# Patient Record
Sex: Male | Born: 1960 | Race: White | Hispanic: No | Marital: Married | Smoking: Former smoker
Health system: Southern US, Community
[De-identification: ages and names within clinical notes are randomized; demographics above are authoritative.]

## PROBLEM LIST (undated history)

## (undated) DIAGNOSIS — J449 Chronic obstructive pulmonary disease, unspecified: Secondary | ICD-10-CM

## (undated) DIAGNOSIS — R911 Solitary pulmonary nodule: Secondary | ICD-10-CM

## (undated) DIAGNOSIS — F419 Anxiety disorder, unspecified: Secondary | ICD-10-CM

## (undated) DIAGNOSIS — N200 Calculus of kidney: Secondary | ICD-10-CM

## (undated) DIAGNOSIS — I10 Essential (primary) hypertension: Secondary | ICD-10-CM

## (undated) DIAGNOSIS — D126 Benign neoplasm of colon, unspecified: Secondary | ICD-10-CM

## (undated) DIAGNOSIS — E119 Type 2 diabetes mellitus without complications: Secondary | ICD-10-CM

## (undated) DIAGNOSIS — I7789 Other specified disorders of arteries and arterioles: Secondary | ICD-10-CM

## (undated) DIAGNOSIS — G4730 Sleep apnea, unspecified: Secondary | ICD-10-CM

## (undated) DIAGNOSIS — D649 Anemia, unspecified: Secondary | ICD-10-CM

## (undated) DIAGNOSIS — M545 Low back pain, unspecified: Secondary | ICD-10-CM

## (undated) DIAGNOSIS — R609 Edema, unspecified: Secondary | ICD-10-CM

## (undated) DIAGNOSIS — E1149 Type 2 diabetes mellitus with other diabetic neurological complication: Secondary | ICD-10-CM

## (undated) HISTORY — DX: Chronic obstructive pulmonary disease, unspecified: J44.9

## (undated) HISTORY — DX: Type 2 diabetes mellitus without complications: E11.9

## (undated) HISTORY — DX: Essential (primary) hypertension: I10

## (undated) HISTORY — DX: Sleep apnea, unspecified: G47.30

## (undated) HISTORY — DX: Benign neoplasm of colon, unspecified: D12.6

## (undated) HISTORY — PX: OTHER SURGICAL HISTORY: SHX169

## (undated) HISTORY — DX: Calculus of kidney: N20.0

## (undated) HISTORY — DX: Type 2 diabetes mellitus with other diabetic neurological complication: E11.49

## (undated) HISTORY — PX: APPENDECTOMY: SHX54

## (undated) HISTORY — DX: Edema, unspecified: R60.9

## (undated) HISTORY — DX: Anxiety disorder, unspecified: F41.9

## (undated) HISTORY — DX: Low back pain, unspecified: M54.50

## (undated) HISTORY — DX: Other specified disorders of arteries and arterioles: I77.89

## (undated) HISTORY — DX: Anemia, unspecified: D64.9

## (undated) HISTORY — DX: Solitary pulmonary nodule: R91.1

---

## 2000-06-19 ENCOUNTER — Encounter: Payer: Self-pay | Admitting: Family Medicine

## 2000-06-19 ENCOUNTER — Encounter: Admission: RE | Admit: 2000-06-19 | Discharge: 2000-06-19 | Payer: Self-pay | Admitting: Family Medicine

## 2007-01-20 ENCOUNTER — Observation Stay (HOSPITAL_COMMUNITY): Admission: RE | Admit: 2007-01-20 | Discharge: 2007-01-21 | Payer: Self-pay | Admitting: Orthopaedic Surgery

## 2009-03-22 IMAGING — RF DG LUMBAR SPINE 1V
1 series · 1 of 1 positions shown · non-contrast
Comparison: none

CLINICAL DATA: Disc herniation. 
 LATERAL LUMBAR SPINE ? 1 VIEW:

[Series 1: run · 1 of 1 slices shown]
[im 1/1]
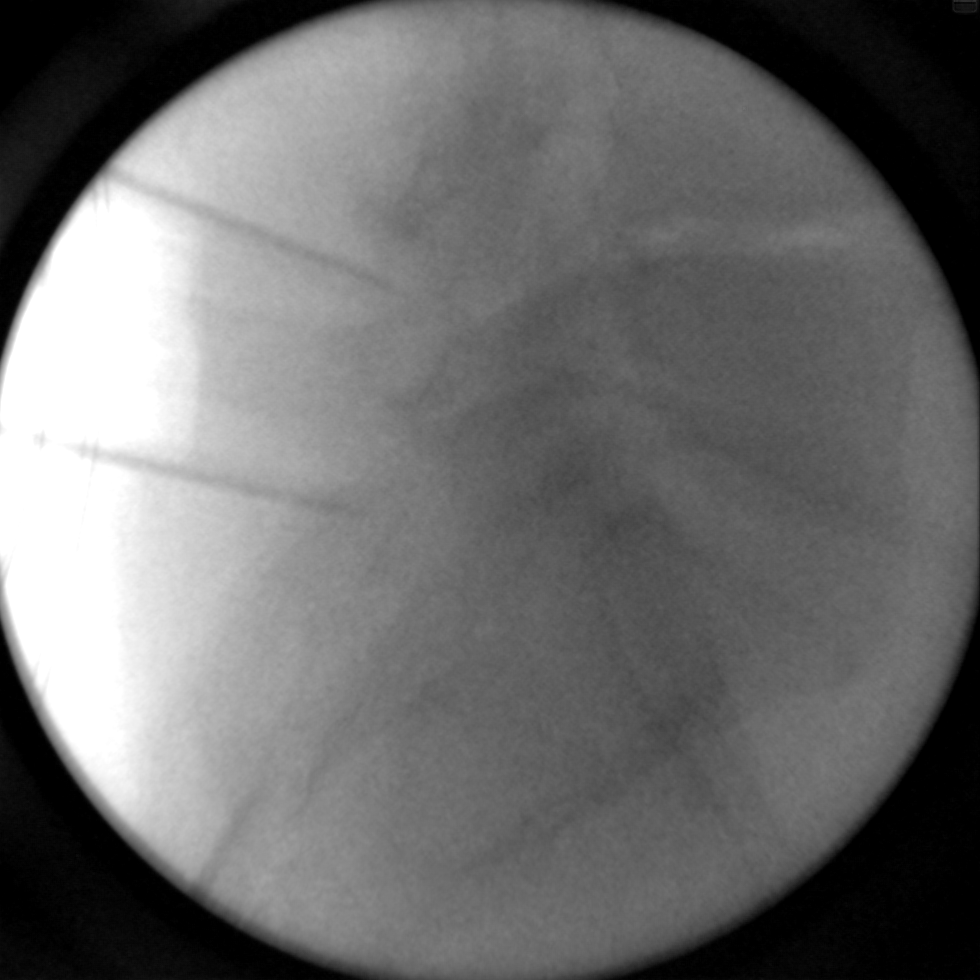

[1 of 1 positions shown; findings below may reference images not displayed]

FINDINGS: A single fluoroscopic intraoperative lateral view of the lumbar spine is provided.  A metallic probe is directed toward approximately the L-5 lamina with a second more inferior probe directed toward the S-1 level.
IMPRESSION: As discussed above.

## 2010-10-01 NOTE — Op Note (Signed)
Danny Ramsey, Danny Ramsey                 ACCOUNT NO.:  [id number]   MEDICAL RECORD NO.:  [id number]          PATIENT TYPE:  INP   LOCATION:  2550                         FACILITY:  [facility]   PHYSICIAN:  Sharolyn Douglas, M.D.        DATE OF BIRTH:  1960/05/28   DATE OF PROCEDURE:  01/20/2007  DATE OF DISCHARGE:                               OPERATIVE REPORT   DIAGNOSES:  1. 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  2. Lumbar degenerative disk disease, status post 2 previous      laminotomies.   PROCEDURE:  Revision right L5-S1 minimally invasive hemilaminotomy and  diskectomy.   SURGEON:  Sharolyn Douglas, M.D.   ASSISTANT:  Aura Fey. Bobbe Medico.   ANESTHESIA:  General endotracheal.   ESTIMATED BLOOD LOSS:  Minimal.   COMPLICATIONS:  None.   COUNTS:  Final needle and sponge count correct.   INDICATIONS:  The patient is a pleasant, 50 year old male with joint  back problems, history of 2 previous lumbar laminotomies for a ruptured  disk.  He has developed acute-on-chronic pain in his back and right leg  consistent with an S1 radiculopathy.  His imaging studies show an  extruded disk herniation at L5-S1 on the right.  He failed other  conservative treatment modalities and now elects to undergo revision  lumbar decompression at L5-S1 on the right side to address his S1  radiculopathy.  The risks, benefits and alternatives were reviewed and  the patient elected to proceed.   DESCRIPTION OF PROCEDURE:  After informed consent, he was taken to the  operating room.  He underwent general endotracheal anesthesia without  difficulty and was given prophylactic IV antibiotics.  Carefully turned  prone onto the Wilson frame and all bony prominences padded.  Face and  eyes protected at all times.  The back was prepped and draped in the  usual sterile fashion.  The inferior portion of his previous incision  was utilized.  The incision measured 1 1/2 inches.  Dissection was  carried through the scar and deep adipose layer.  The patient is a very  large man and the depth of the incision exceeded 8 cm.  The deep fascia  was incised and dilators from the minimally invasive Abbott spine  retractor were used to dock onto the L5-S1 interspace on the right side.  Fluoroscopy was used to place the retractor over the interspace.  The 80-  mm retractor blades were placed onto the retractor and over the final  dilator.  The retractor was attached to the operating room table and  then gently spread to expose the L5-S1 interspace.  Final fluoroscopic  image was taken, confirming correct positioning.  The microscope was  draped and brought into the field.  Using a high-speed bur, the inferior  1/3 of the L5 lamina was removed, along with the medial 1/3 of the facet  joint complex.  Ligamentum flavum was removed piecemeal.  The lateral  border of the thecal sac and S1 nerve root were identified and gently  mobilized medially.  We found a bulging disk immediately, and this was  entered using a 15 blade.  This was decompressed back into the  interspace using Epstein curets and then removed with a pituitary  rongeur.  We then proceeded to remove multiple degenerative fragments of  disk, which were underneath the ligament.  We explored the spinal canal  distally medial to the S1 pedicle and removed additional fragments.  In  the end, we were satisfied with the decompression.  We had removed all  loose material from the interspace, and also were satisfied that the S1  nerve root was decompressed in the lateral recess.  Hemostasis was  achieved using bipolar electrocautery and Gelfoam.  A small amount of  Gelfoam was left over the exposed epidural space.  The deep fascia was  closed with #1 Vicryl, the subcutaneous layer closed with 0 Vicryl and 2-  0 Vicryl, followed by a running 3-0 subcuticular Vicryl suture on the  skin edges.  Marcaine 0.5% 10 mL with epinephrine injected  for  postoperative analgesia.  Dermabond was applied to the skin edges.  The  patient was turned supine, extubated without difficulty and transferred  to recovery in stable condition.   It should be noted that my assistant, [name], P.A., was present  throughout the procedure.  She assisted me with the positioning.  She  assisted with exposure, using suction.  She then worked with me under  the microscope during the decompression, retracting the neural elements  and providing suction.  Finally, she assisted with wound closure.      Sharolyn Douglas, M.D.  Electronically Signed     MC/MEDQ  D:  01/20/2007  T:  01/21/2007  Job:  [id number]

## 2011-02-28 LAB — CBC
HCT: 38.3 — ABNORMAL LOW
Hemoglobin: 12.9 — ABNORMAL LOW
MCHC: 33.6
MCV: 86.6
Platelets: 285
RBC: 4.42
RDW: 13.9
WBC: 15.6 — ABNORMAL HIGH

## 2011-02-28 LAB — URINE CULTURE: Colony Count: NO GROWTH

## 2011-02-28 LAB — COMPREHENSIVE METABOLIC PANEL
AST: 25
Albumin: 3.8
Calcium: 9.3
Creatinine, Ser: 1.01
GFR calc Af Amer: >60
GFR calc non Af Amer: >60
Sodium: 139
Total Protein: 6.5

## 2011-02-28 LAB — DIFFERENTIAL
Basophils Relative: 0
Eosinophils Relative: 1
Lymphs Abs: 4.2 — ABNORMAL HIGH
Monocytes Relative: 6
Neutro Abs: 10.3 — ABNORMAL HIGH

## 2011-02-28 LAB — TYPE AND SCREEN: Antibody Screen: NEGATIVE

## 2011-02-28 LAB — URINALYSIS, ROUTINE W REFLEX MICROSCOPIC
Ketones, ur: NEGATIVE
Nitrite: NEGATIVE
pH: 5.5

## 2011-02-28 LAB — APTT: aPTT: 30

## 2011-06-30 ENCOUNTER — Other Ambulatory Visit: Payer: Self-pay | Admitting: Oncology

## 2011-06-30 ENCOUNTER — Other Ambulatory Visit (HOSPITAL_COMMUNITY)
Admission: RE | Admit: 2011-06-30 | Discharge: 2011-06-30 | Disposition: A | Discharge status: Home / Self Care | Payer: Commercial Managed Care - PPO | Source: Ambulatory Visit | Attending: Oncology | Admitting: Oncology

## 2011-06-30 DIAGNOSIS — D72829 Elevated white blood cell count, unspecified: Secondary | ICD-10-CM | POA: Insufficient documentation

## 2013-10-05 HISTORY — PX: COLONOSCOPY: SHX174

## 2015-06-05 DIAGNOSIS — E119 Type 2 diabetes mellitus without complications: Secondary | ICD-10-CM | POA: Diagnosis not present

## 2015-06-05 DIAGNOSIS — C9110 Chronic lymphocytic leukemia of B-cell type not having achieved remission: Secondary | ICD-10-CM | POA: Diagnosis not present

## 2015-06-05 DIAGNOSIS — Z720 Tobacco use: Secondary | ICD-10-CM | POA: Diagnosis not present

## 2016-09-10 DIAGNOSIS — I10 Essential (primary) hypertension: Secondary | ICD-10-CM | POA: Diagnosis not present

## 2016-09-10 DIAGNOSIS — G4730 Sleep apnea, unspecified: Secondary | ICD-10-CM | POA: Diagnosis not present

## 2016-09-10 DIAGNOSIS — Z125 Encounter for screening for malignant neoplasm of prostate: Secondary | ICD-10-CM | POA: Diagnosis not present

## 2016-09-10 DIAGNOSIS — E1149 Type 2 diabetes mellitus with other diabetic neurological complication: Secondary | ICD-10-CM | POA: Diagnosis not present

## 2017-03-20 DIAGNOSIS — C9190 Lymphoid leukemia, unspecified not having achieved remission: Secondary | ICD-10-CM | POA: Diagnosis not present

## 2017-03-20 DIAGNOSIS — E1149 Type 2 diabetes mellitus with other diabetic neurological complication: Secondary | ICD-10-CM | POA: Diagnosis not present

## 2017-03-20 DIAGNOSIS — I10 Essential (primary) hypertension: Secondary | ICD-10-CM | POA: Diagnosis not present

## 2017-03-20 DIAGNOSIS — D126 Benign neoplasm of colon, unspecified: Secondary | ICD-10-CM | POA: Diagnosis not present

## 2017-09-25 DIAGNOSIS — C9190 Lymphoid leukemia, unspecified not having achieved remission: Secondary | ICD-10-CM | POA: Diagnosis not present

## 2017-09-25 DIAGNOSIS — I10 Essential (primary) hypertension: Secondary | ICD-10-CM | POA: Diagnosis not present

## 2017-09-25 DIAGNOSIS — J449 Chronic obstructive pulmonary disease, unspecified: Secondary | ICD-10-CM | POA: Diagnosis not present

## 2017-09-25 DIAGNOSIS — E1149 Type 2 diabetes mellitus with other diabetic neurological complication: Secondary | ICD-10-CM | POA: Diagnosis not present

## 2018-03-31 DIAGNOSIS — E1149 Type 2 diabetes mellitus with other diabetic neurological complication: Secondary | ICD-10-CM | POA: Diagnosis not present

## 2018-03-31 DIAGNOSIS — Z23 Encounter for immunization: Secondary | ICD-10-CM | POA: Diagnosis not present

## 2018-03-31 DIAGNOSIS — C9110 Chronic lymphocytic leukemia of B-cell type not having achieved remission: Secondary | ICD-10-CM | POA: Diagnosis not present

## 2018-03-31 DIAGNOSIS — I10 Essential (primary) hypertension: Secondary | ICD-10-CM | POA: Diagnosis not present

## 2018-03-31 DIAGNOSIS — J449 Chronic obstructive pulmonary disease, unspecified: Secondary | ICD-10-CM | POA: Diagnosis not present

## 2018-10-05 DIAGNOSIS — I10 Essential (primary) hypertension: Secondary | ICD-10-CM | POA: Diagnosis not present

## 2018-10-05 DIAGNOSIS — J449 Chronic obstructive pulmonary disease, unspecified: Secondary | ICD-10-CM | POA: Diagnosis not present

## 2018-10-05 DIAGNOSIS — E1149 Type 2 diabetes mellitus with other diabetic neurological complication: Secondary | ICD-10-CM | POA: Diagnosis not present

## 2019-07-12 ENCOUNTER — Encounter: Payer: Self-pay | Admitting: Gastroenterology

## 2019-08-05 ENCOUNTER — Encounter: Payer: Commercial Managed Care - PPO | Admitting: Gastroenterology

## 2020-06-02 DIAGNOSIS — I517 Cardiomegaly: Secondary | ICD-10-CM

## 2020-11-13 ENCOUNTER — Other Ambulatory Visit: Payer: Self-pay

## 2020-11-13 DIAGNOSIS — M545 Low back pain, unspecified: Secondary | ICD-10-CM | POA: Insufficient documentation

## 2020-11-13 DIAGNOSIS — D649 Anemia, unspecified: Secondary | ICD-10-CM | POA: Insufficient documentation

## 2020-11-13 DIAGNOSIS — R609 Edema, unspecified: Secondary | ICD-10-CM | POA: Insufficient documentation

## 2020-11-13 DIAGNOSIS — D126 Benign neoplasm of colon, unspecified: Secondary | ICD-10-CM | POA: Insufficient documentation

## 2020-11-13 DIAGNOSIS — E1149 Type 2 diabetes mellitus with other diabetic neurological complication: Secondary | ICD-10-CM | POA: Insufficient documentation

## 2020-11-13 DIAGNOSIS — F419 Anxiety disorder, unspecified: Secondary | ICD-10-CM | POA: Insufficient documentation

## 2020-11-13 DIAGNOSIS — G4730 Sleep apnea, unspecified: Secondary | ICD-10-CM | POA: Insufficient documentation

## 2020-11-13 DIAGNOSIS — I7789 Other specified disorders of arteries and arterioles: Secondary | ICD-10-CM | POA: Insufficient documentation

## 2020-11-13 DIAGNOSIS — N200 Calculus of kidney: Secondary | ICD-10-CM | POA: Insufficient documentation

## 2020-11-13 DIAGNOSIS — I10 Essential (primary) hypertension: Secondary | ICD-10-CM | POA: Insufficient documentation

## 2020-11-13 DIAGNOSIS — J449 Chronic obstructive pulmonary disease, unspecified: Secondary | ICD-10-CM | POA: Insufficient documentation

## 2020-11-13 DIAGNOSIS — R911 Solitary pulmonary nodule: Secondary | ICD-10-CM | POA: Insufficient documentation

## 2020-11-15 ENCOUNTER — Ambulatory Visit: Payer: Self-pay | Admitting: Cardiology

## 2020-12-12 ENCOUNTER — Other Ambulatory Visit: Payer: Self-pay

## 2020-12-12 DIAGNOSIS — N200 Calculus of kidney: Secondary | ICD-10-CM | POA: Insufficient documentation

## 2020-12-12 DIAGNOSIS — E119 Type 2 diabetes mellitus without complications: Secondary | ICD-10-CM | POA: Insufficient documentation

## 2020-12-14 ENCOUNTER — Ambulatory Visit: Payer: Managed Care, Other (non HMO) | Admitting: Cardiology

## 2020-12-14 ENCOUNTER — Other Ambulatory Visit: Payer: Self-pay

## 2020-12-14 ENCOUNTER — Encounter: Payer: Self-pay | Admitting: Cardiology

## 2020-12-14 VITALS — BP 186/84 | HR 65 | Ht 73.0 in | Wt 301.0 lb

## 2020-12-14 DIAGNOSIS — I10 Essential (primary) hypertension: Secondary | ICD-10-CM | POA: Diagnosis not present

## 2020-12-14 DIAGNOSIS — G4730 Sleep apnea, unspecified: Secondary | ICD-10-CM

## 2020-12-14 DIAGNOSIS — I700 Atherosclerosis of aorta: Secondary | ICD-10-CM

## 2020-12-14 DIAGNOSIS — I7789 Other specified disorders of arteries and arterioles: Secondary | ICD-10-CM | POA: Diagnosis not present

## 2020-12-14 DIAGNOSIS — E1149 Type 2 diabetes mellitus with other diabetic neurological complication: Secondary | ICD-10-CM

## 2020-12-14 DIAGNOSIS — J431 Panlobular emphysema: Secondary | ICD-10-CM

## 2020-12-14 DIAGNOSIS — F17210 Nicotine dependence, cigarettes, uncomplicated: Secondary | ICD-10-CM

## 2020-12-14 HISTORY — DX: Atherosclerosis of aorta: I70.0

## 2020-12-14 HISTORY — DX: Nicotine dependence, cigarettes, uncomplicated: F17.210

## 2020-12-14 MED ORDER — ATORVASTATIN CALCIUM 10 MG PO TABS: 10.0000 mg | ORAL_TABLET | Freq: Every day | ORAL | 3 refills | Status: DC

## 2020-12-14 NOTE — Patient Instructions (Signed)
Medication Instructions:  Your physician has recommended you make the following change in your medication:   Start Atorvastatin 10 mg daily.  *If you need a refill on your cardiac medications before your next appointment, please call your pharmacy*   Lab Work: Your physician recommends that you return for lab work in: 6 weeks You need to have labs done when you are fasting.  You can come Monday through Friday 8:30 am to 12:00 pm and 1:15 to 4:30. You do not need to make an appointment as the order has already been placed. The labs you are going to have done are BMET, LFT and Lipids.  If you have labs (blood work) drawn today and your tests are completely normal, you will receive your results only by: [place] (if you have MyChart) OR A paper copy in the mail If you have any lab test that is abnormal or we need to change your treatment, we will call you to review the results.   Testing/Procedures: None ordered   Follow-Up: At [facility], you and your health needs are our priority.  As part of our continuing mission to provide you with exceptional heart care, we have created designated Provider Care Teams.  These Care Teams include your primary Cardiologist (physician) and Advanced Practice Providers (APPs -  Physician Assistants and Nurse Practitioners) who all work together to provide you with the care you need, when you need it.  We recommend signing up for the patient portal called "MyChart".  Sign up information is provided on this After Visit Summary.  MyChart is used to connect with patients for Virtual Visits (Telemedicine).  Patients are able to view lab/test results, encounter notes, upcoming appointments, etc.  Non-urgent messages can be sent to your provider as well.   To learn more about what you can do with MyChart, go to [email or web address].    Your next appointment:   6 month(s)  The format for your next appointment:   In Person  Provider:   Jyl Heinz, MD   Other Instructions NA

## 2020-12-14 NOTE — Progress Notes (Signed)
Cardiology Office Note:    Date:  12/14/2020   ID:  Danny Ramsey, DOB 1961-02-20, MRN [id number]  PCP:  Danny Bender, PA-C  Cardiologist:  Danny Lindau, MD   Referring MD: Danny Bender, PA-C    ASSESSMENT:    1. Ascending aorta enlargement ([place])   2. Primary hypertension   3. Panlobular emphysema ([place])   4. Sleep apnea, unspecified type   5. 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 Cigarette smoker   7. Aortic calcification (HCC)    PLAN:    In order of problems listed above:  I reviewed records from primary care and [place] hospital extensively and discussed with him. Aortic calcification: Stable at this time.  Secondary prevention stressed and test report discussed with him.  Stress echo report was also discussed with him. Secondary prevention with statins: In view of aortic atherosclerosis I recommended statin therapy.  We will check records from primary care he had recent blood work and initiate him on atorvastatin 10 mg daily.  Liver lipid check in 6 weeks was recommended and he agrees. Obesity: Weight reduction was stressed and diet was emphasized and he promises to do better. Essential hypertension: Patient mentions to me that his blood pressures run high at doctor's offices.  He will keep a track of his blood pressures at home and bring it to [place] in a week and will titrate his medicines accordingly. Diabetes mellitus: Diet was encouraged.  Sleep health issues were discussed for sleep apnea and he promises to do better. Cigarette smoker: I spent 5 minutes with the patient discussing solely about smoking. Smoking cessation was counseled. I suggested to the patient also different medications and pharmacological interventions. Patient is keen to try stopping on its own at this time. He will get back to me if he needs any further assistance in this matter. Patient will be seen in follow-up appointment in 6 months or earlier if the patient has  any concerns    Medication Adjustments/Labs and Tests Ordered: Current medicines are reviewed at length with the patient today.  Concerns regarding medicines are outlined above.  No orders of the defined types were placed in this encounter.  No orders of the defined types were placed in this encounter.    History of Present Illness:    Danny Ramsey is a 60 y.o. male who is being seen today for the evaluation of ascending aortic enlargement at the request of Danny Bender, PA-C.  Patient has past medical history of aortic atherosclerosis, essential hypertension, dyslipidemia, morbid obesity, diabetes mellitus and COPD on oxygen.  He had a stress test recently which was unremarkable.  History of for evaluation for ascending aortic aneurysm.  He denies any chest pain orthopnea or PND.  He leads a sedentary lifestyle.  He has morbid abdominal obesity.  His wife accompanies him for this visit.  At the time of my evaluation, the patient is alert awake oriented and in no distress.  Past Medical History:  Diagnosis Date   Anemia    Anxiety    Ascending aorta enlargement (HCC)    Calculus of kidney    COPD (chronic obstructive pulmonary disease) (HCC)    Diabetes ([place])    Edema    Hypertension    Lumbago    chronic   Nephrolithiasis    Nodule of middle lobe of right lung    1.4cm   Sleep apnea    Tubulovillous adenoma polyp of colon  Type 2 diabetes mellitus with neurological manifestations [facility])     Past Surgical History:  Procedure Laterality Date   APPENDECTOMY     COLONOSCOPY  10/05/2013   hernia repair      Current Medications: Current Meds  Medication Sig   acetaminophen (TYLENOL 8 HOUR ARTHRITIS PAIN) 650 MG CR tablet Take 1,300 mg by mouth 2 (two) times daily.   albuterol (PROVENTIL) (2.5 MG/3ML) 0.083% nebulizer solution Inhale 2.5 mg into the lungs daily.   albuterol (VENTOLIN HFA) 108 (90 Base) MCG/ACT inhaler Inhale 1 puff into the lungs 4 (four) times daily as  needed for wheezing or shortness of breath.   amLODipine (NORVASC) 10 MG tablet Take 10 mg by mouth daily.   aspirin EC 81 MG tablet Take 81 mg by mouth daily.   Boswellia-Glucosamine-Vit D (OSTEO BI-FLEX ONE PER DAY PO) Take 2 tablets by mouth every morning.   budesonide (PULMICORT) 0.25 MG/2ML nebulizer solution Take 0.25 mg by nebulization 2 (two) times daily.   carvedilol (COREG) 6.25 MG tablet Take 6.25 mg by mouth 2 (two) times daily.   doxazosin (CARDURA) 8 MG tablet Take 8 mg by mouth daily.   FIBER PO Take 1 capsule by mouth daily.   Fluticasone Furoate-Vilanterol (BREO ELLIPTA IN) Inhale 1 puff into the lungs every morning.   gabapentin (NEURONTIN) 400 MG capsule Take 400 mg by mouth daily.   glipiZIDE-metformin (METAGLIP) 5-500 MG tablet Take 2 tablets by mouth 2 (two) times daily.   ipratropium-albuterol (DUONEB) 0.5-2.5 (3) MG/3ML SOLN Inhale 3 mLs into the lungs every 8 (eight) hours as needed for wheezing or shortness of breath.   JANUVIA 100 MG tablet Take 100 mg by mouth daily.   loratadine (ALLERGY RELIEF) 10 MG tablet Take 10 mg by mouth daily.   losartan-hydrochlorothiazide (HYZAAR) 100-25 MG tablet Take 1 tablet by mouth daily.   metoprolol succinate (TOPROL-XL) 50 MG 24 hr tablet Take 50 mg by mouth daily.     Allergies:   Ace inhibitors, Actos [pioglitazone], Amitriptyline, and Tricyclic antidepressants   Social History   Socioeconomic History   Marital status: Married    Spouse name: Not on file   Number of children: Not on file   Years of education: Not on file   Highest education level: Not on file  Occupational History   Not on file  Tobacco Use   Smoking status: Former    Types: Cigarettes    Quit date: 06/12/2010    Years since quitting: 10.5   Smokeless tobacco: Former  Substance and Sexual Activity   Alcohol use: Yes    Comment: social   Drug use: Never   Sexual activity: Not on file  Other Topics Concern   Not on file  Social History Narrative    Not on file   Social Determinants of Health   Financial Resource Strain: Not on file  Food Insecurity: Not on file  Transportation Needs: Not on file  Physical Activity: Not on file  Stress: Not on file  Social Connections: Not on file     Family History: The patient's family history includes Lung cancer in his father and mother.  ROS:   Please see the history of present illness.    All other systems reviewed and are negative.  EKGs/Labs/Other Studies Reviewed:    The following studies were reviewed today: EKG reveals sinus rhythm and nonspecific ST-T changes   Recent Labs: No results found for requested labs within last 8760 hours.  Recent Lipid  Panel No results found for: CHOL, TRIG, HDL, CHOLHDL, VLDL, LDLCALC, LDLDIRECT  Physical Exam:    VS:  BP (!) 186/84   Pulse 65   Ht '6\' 1"'$  (1.854 m)   Wt (!) 301 lb (136.5 kg)   SpO2 94%   BMI 39.71 kg/m     Wt Readings from Last 3 Encounters:  12/14/20 (!) 301 lb (136.5 kg)     GEN: Patient is in no acute distress HEENT: Normal NECK: No JVD; No carotid bruits LYMPHATICS: No lymphadenopathy CARDIAC: S1 S2 regular, 2/6 systolic murmur at the apex. RESPIRATORY:  Clear to auscultation without rales, wheezing or rhonchi  ABDOMEN: Soft, non-tender, non-distended MUSCULOSKELETAL:  No edema; No deformity  SKIN: Warm and dry NEUROLOGIC:  Alert and oriented x 3 PSYCHIATRIC:  Normal affect    Signed, Danny Lindau, MD  12/14/2020 11:45 AM    [place]

## 2021-06-07 DIAGNOSIS — Z6841 Body Mass Index (BMI) 40.0 and over, adult: Secondary | ICD-10-CM | POA: Diagnosis not present

## 2021-06-07 DIAGNOSIS — G4730 Sleep apnea, unspecified: Secondary | ICD-10-CM | POA: Diagnosis not present

## 2021-06-07 DIAGNOSIS — J449 Chronic obstructive pulmonary disease, unspecified: Secondary | ICD-10-CM | POA: Diagnosis not present

## 2021-06-07 DIAGNOSIS — E1149 Type 2 diabetes mellitus with other diabetic neurological complication: Secondary | ICD-10-CM | POA: Diagnosis not present

## 2021-06-07 DIAGNOSIS — I7789 Other specified disorders of arteries and arterioles: Secondary | ICD-10-CM | POA: Diagnosis not present

## 2021-06-07 DIAGNOSIS — I10 Essential (primary) hypertension: Secondary | ICD-10-CM | POA: Diagnosis not present

## 2021-06-07 DIAGNOSIS — C9110 Chronic lymphocytic leukemia of B-cell type not having achieved remission: Secondary | ICD-10-CM | POA: Diagnosis not present

## 2021-06-11 DIAGNOSIS — G4733 Obstructive sleep apnea (adult) (pediatric): Secondary | ICD-10-CM | POA: Diagnosis not present

## 2021-06-18 ENCOUNTER — Ambulatory Visit: Payer: 59 | Admitting: Cardiology

## 2021-06-18 ENCOUNTER — Other Ambulatory Visit: Payer: Self-pay

## 2021-06-18 ENCOUNTER — Encounter: Payer: Self-pay | Admitting: Cardiology

## 2021-06-18 VITALS — BP 160/76 | HR 76 | Ht 73.0 in | Wt 301.2 lb

## 2021-06-18 DIAGNOSIS — I700 Atherosclerosis of aorta: Secondary | ICD-10-CM

## 2021-06-18 DIAGNOSIS — I10 Essential (primary) hypertension: Secondary | ICD-10-CM

## 2021-06-18 DIAGNOSIS — E1149 Type 2 diabetes mellitus with other diabetic neurological complication: Secondary | ICD-10-CM

## 2021-06-18 DIAGNOSIS — I7789 Other specified disorders of arteries and arterioles: Secondary | ICD-10-CM

## 2021-06-18 DIAGNOSIS — F17210 Nicotine dependence, cigarettes, uncomplicated: Secondary | ICD-10-CM

## 2021-06-18 DIAGNOSIS — J431 Panlobular emphysema: Secondary | ICD-10-CM

## 2021-06-18 NOTE — Patient Instructions (Signed)

## 2021-06-18 NOTE — Progress Notes (Signed)
Cardiology Office Note:    Date:  06/18/2021   ID:  Danny Ramsey, DOB Jul 18, 1960, MRN [id number]  PCP:  Cyndi Bender, PA-C  Cardiologist:  Jenean Lindau, MD   Referring MD: Cyndi Bender, PA-C    ASSESSMENT:    1. Ascending aorta enlargement ([place])   2. Primary hypertension   3. Aortic calcification (HCC)   4. Type 2 diabetes mellitus with neurological manifestations ([place])   5. Panlobular emphysema ([place])   6. Cigarette smoker    PLAN:    In order of problems listed above:  Aortic calcification: Secondary prevention stressed with the patient.  Importance of compliance with diet medication stressed any vocalized understanding. Ascending aortic aneurysm: This will need to be rechecked and patient will have an appointment to get it done.  I discussed this with her at length with the patient. Essential hypertension: Blood pressure stable and diet was emphasized.  He tells me that his blood pressures are fine at home and mention to me the readings.  Lifestyle modification urged. Diabetes mellitus, obesity and mixed dyslipidemia: Diet was emphasized.  I told him he could try other medications other than statins but was vehemently against them.  I respect his wishes.  Risks emphasized and he understands. Patient will be seen in follow-up appointment in 6 months or earlier if the patient has any concerns    Medication Adjustments/Labs and Tests Ordered: Current medicines are reviewed at length with the patient today.  Concerns regarding medicines are outlined above.  No orders of the defined types were placed in this encounter.  No orders of the defined types were placed in this encounter.    No chief complaint on file.    History of Present Illness:    Danny Ramsey is a 61 y.o. male.  Patient has past medical history of essential hypertension, mixed dyslipidemia, diabetes mellitus and aortic calcification.  He fortunately has quit smoking.  He denies any chest pain  orthopnea or PND.  At the time of my evaluation, the patient is alert awake oriented and in no distress.  Patient mentions to me that he does not want to take statin as he feels he has myalgias.  He is not interested in any other lipid-lowering medication.  At the time of my evaluation, the patient is alert awake oriented and in no distress.  Past Medical History:  Diagnosis Date   Anemia    Anxiety    Aortic calcification ([place]) 12/14/2020   Ascending aorta enlargement (HCC)    Calculus of kidney    Cigarette smoker 12/14/2020   COPD (chronic obstructive pulmonary disease) (HCC)    Diabetes (HCC)    Edema    Hypertension    Lumbago    chronic   Nephrolithiasis    Nodule of middle lobe of right lung    1.4cm   Sleep apnea    Tubulovillous adenoma polyp of colon    Type 2 diabetes mellitus with neurological manifestations [facility])     Past Surgical History:  Procedure Laterality Date   APPENDECTOMY     COLONOSCOPY  10/05/2013   hernia repair      Current Medications: Current Meds  Medication Sig   acetaminophen (TYLENOL) 650 MG CR tablet Take 1,300 mg by mouth as needed for pain.   albuterol (VENTOLIN HFA) 108 (90 Base) MCG/ACT inhaler Inhale 2 puffs into the lungs every 6 (six) hours.   doxazosin (CARDURA) 4 MG tablet Take 4 mg by mouth daily.  doxazosin (CARDURA) 8 MG tablet Take 8 mg by mouth daily.   Fluticasone Furoate-Vilanterol (BREO ELLIPTA IN) Inhale 1 puff into the lungs every morning.   furosemide (LASIX) 20 MG tablet Take 20 mg by mouth as needed for fluid or edema.   gabapentin (NEURONTIN) 400 MG capsule Take 400 mg by mouth 2 (two) times daily.   glipiZIDE (GLUCOTROL) 10 MG tablet Take 10 mg by mouth 2 (two) times daily.   ipratropium-albuterol (DUONEB) 0.5-2.5 (3) MG/3ML SOLN Inhale 3 mLs into the lungs every 6 (six) hours.   JANUVIA 100 MG tablet Take 100 mg by mouth daily.   losartan-hydrochlorothiazide (HYZAAR) 100-25 MG tablet Take 1 tablet by mouth daily.    metFORMIN (GLUCOPHAGE) 500 MG tablet Take 2,000 mg by mouth daily.     Allergies:   Ace inhibitors, Actos [pioglitazone], Amitriptyline, Lipitor [atorvastatin], and Tricyclic antidepressants   Social History   Socioeconomic History   Marital status: Married    Spouse name: Not on file   Number of children: Not on file   Years of education: Not on file   Highest education level: Not on file  Occupational History   Not on file  Tobacco Use   Smoking status: Former    Types: Cigarettes    Quit date: 06/12/2010    Years since quitting: 11.0   Smokeless tobacco: Former  Substance and Sexual Activity   Alcohol use: Yes    Comment: social   Drug use: Never   Sexual activity: Not on file  Other Topics Concern   Not on file  Social History Narrative   Not on file   Social Determinants of Health   Financial Resource Strain: Not on file  Food Insecurity: Not on file  Transportation Needs: Not on file  Physical Activity: Not on file  Stress: Not on file  Social Connections: Not on file     Family History: The patient's family history includes Lung cancer in his father and mother.  ROS:   Please see the history of present illness.    All other systems reviewed and are negative.  EKGs/Labs/Other Studies Reviewed:    The following studies were reviewed today: I discussed my findings with the patient at length including CT chest   Recent Labs: No results found for requested labs within last 8760 hours.  Recent Lipid Panel No results found for: CHOL, TRIG, HDL, CHOLHDL, VLDL, LDLCALC, LDLDIRECT  Physical Exam:    VS:  BP (!) 160/76    Pulse 76    Ht 6\' 1"  (1.854 m)    Wt (!) 301 lb 3.2 oz (136.6 kg)    SpO2 95%    BMI 39.74 kg/m     Wt Readings from Last 3 Encounters:  06/18/21 (!) 301 lb 3.2 oz (136.6 kg)  12/14/20 (!) 301 lb (136.5 kg)     GEN: Patient is in no acute distress HEENT: Normal NECK: No JVD; No carotid bruits LYMPHATICS: No lymphadenopathy CARDIAC:  Hear sounds regular, 2/6 systolic murmur at the apex. RESPIRATORY:  Clear to auscultation without rales, wheezing or rhonchi  ABDOMEN: Soft, non-tender, non-distended MUSCULOSKELETAL:  No edema; No deformity  SKIN: Warm and dry NEUROLOGIC:  Alert and oriented x 3 PSYCHIATRIC:  Normal affect   Signed, Jenean Lindau, MD  06/18/2021 1:21 PM    [facility]

## 2021-07-12 DIAGNOSIS — G4733 Obstructive sleep apnea (adult) (pediatric): Secondary | ICD-10-CM | POA: Diagnosis not present

## 2021-08-09 DIAGNOSIS — G4733 Obstructive sleep apnea (adult) (pediatric): Secondary | ICD-10-CM | POA: Diagnosis not present

## 2021-09-02 DIAGNOSIS — J449 Chronic obstructive pulmonary disease, unspecified: Secondary | ICD-10-CM | POA: Diagnosis not present

## 2021-09-09 DIAGNOSIS — G4733 Obstructive sleep apnea (adult) (pediatric): Secondary | ICD-10-CM | POA: Diagnosis not present

## 2021-09-10 DIAGNOSIS — J449 Chronic obstructive pulmonary disease, unspecified: Secondary | ICD-10-CM | POA: Diagnosis not present

## 2021-09-10 DIAGNOSIS — E1149 Type 2 diabetes mellitus with other diabetic neurological complication: Secondary | ICD-10-CM | POA: Diagnosis not present

## 2021-09-10 DIAGNOSIS — I7789 Other specified disorders of arteries and arterioles: Secondary | ICD-10-CM | POA: Diagnosis not present

## 2021-09-10 DIAGNOSIS — G4730 Sleep apnea, unspecified: Secondary | ICD-10-CM | POA: Diagnosis not present

## 2021-09-10 DIAGNOSIS — Z1331 Encounter for screening for depression: Secondary | ICD-10-CM | POA: Diagnosis not present

## 2021-09-10 DIAGNOSIS — C9110 Chronic lymphocytic leukemia of B-cell type not having achieved remission: Secondary | ICD-10-CM | POA: Diagnosis not present

## 2021-09-10 DIAGNOSIS — I10 Essential (primary) hypertension: Secondary | ICD-10-CM | POA: Diagnosis not present

## 2021-09-10 DIAGNOSIS — G4733 Obstructive sleep apnea (adult) (pediatric): Secondary | ICD-10-CM | POA: Diagnosis not present

## 2021-09-26 ENCOUNTER — Telehealth: Payer: Self-pay | Admitting: Cardiology

## 2021-09-26 DIAGNOSIS — I7789 Other specified disorders of arteries and arterioles: Secondary | ICD-10-CM

## 2021-09-26 NOTE — Telephone Encounter (Signed)
Wife of the patient called. She needs [place] to help get the patient's CT approved. Please call the wife  ?

## 2021-09-26 NOTE — Telephone Encounter (Signed)
Order has been placed for a CT chest. Pt's wife Almyra Free per DPR aware. ?

## 2021-10-02 DIAGNOSIS — J449 Chronic obstructive pulmonary disease, unspecified: Secondary | ICD-10-CM | POA: Diagnosis not present

## 2021-10-09 DIAGNOSIS — G4733 Obstructive sleep apnea (adult) (pediatric): Secondary | ICD-10-CM | POA: Diagnosis not present

## 2021-10-10 DIAGNOSIS — G4733 Obstructive sleep apnea (adult) (pediatric): Secondary | ICD-10-CM | POA: Diagnosis not present

## 2021-10-31 DIAGNOSIS — I7789 Other specified disorders of arteries and arterioles: Secondary | ICD-10-CM | POA: Diagnosis not present

## 2021-10-31 DIAGNOSIS — R918 Other nonspecific abnormal finding of lung field: Secondary | ICD-10-CM | POA: Diagnosis not present

## 2021-10-31 DIAGNOSIS — R911 Solitary pulmonary nodule: Secondary | ICD-10-CM | POA: Diagnosis not present

## 2021-11-02 DIAGNOSIS — J449 Chronic obstructive pulmonary disease, unspecified: Secondary | ICD-10-CM | POA: Diagnosis not present

## 2021-11-08 ENCOUNTER — Telehealth: Payer: Self-pay

## 2021-11-09 DIAGNOSIS — G4733 Obstructive sleep apnea (adult) (pediatric): Secondary | ICD-10-CM | POA: Diagnosis not present

## 2021-11-10 DIAGNOSIS — G4733 Obstructive sleep apnea (adult) (pediatric): Secondary | ICD-10-CM | POA: Diagnosis not present

## 2021-12-02 DIAGNOSIS — J449 Chronic obstructive pulmonary disease, unspecified: Secondary | ICD-10-CM | POA: Diagnosis not present

## 2021-12-09 DIAGNOSIS — G4733 Obstructive sleep apnea (adult) (pediatric): Secondary | ICD-10-CM | POA: Diagnosis not present

## 2021-12-10 ENCOUNTER — Ambulatory Visit: Payer: 59 | Admitting: Cardiology

## 2021-12-24 ENCOUNTER — Encounter: Payer: Self-pay | Admitting: Cardiology

## 2021-12-24 ENCOUNTER — Ambulatory Visit: Payer: 59 | Admitting: Cardiology

## 2021-12-24 VITALS — BP 168/62 | HR 82 | Ht 72.0 in | Wt 305.0 lb

## 2021-12-24 DIAGNOSIS — I7789 Other specified disorders of arteries and arterioles: Secondary | ICD-10-CM

## 2021-12-24 DIAGNOSIS — F17210 Nicotine dependence, cigarettes, uncomplicated: Secondary | ICD-10-CM | POA: Diagnosis not present

## 2021-12-24 DIAGNOSIS — I700 Atherosclerosis of aorta: Secondary | ICD-10-CM

## 2021-12-24 DIAGNOSIS — E1149 Type 2 diabetes mellitus with other diabetic neurological complication: Secondary | ICD-10-CM

## 2021-12-24 DIAGNOSIS — R69 Illness, unspecified: Secondary | ICD-10-CM | POA: Diagnosis not present

## 2021-12-24 NOTE — Progress Notes (Signed)
Cardiology Office Note:    Date:  12/24/2021   ID:  Danny Ramsey, DOB 1960-06-16, MRN [id number]  PCP:  Cyndi Bender, PA-C  Cardiologist:  Jenean Lindau, MD   Referring MD: Cyndi Bender, PA-C    ASSESSMENT:    1. Ascending aorta enlargement ([place])   2. Aortic calcification (HCC)   3. Cigarette smoker   4. 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  Importance of compliance with diet medication stressed any vocalized understanding. Aortic calcification and ascending aortic dilatation: I discussed this with the patient at extensive length and questions were answered to her satisfaction.  He needs to be on statins but he cannot tolerate them.  I told him about alternative therapies and he plans to think about it and talk to his primary care physicians.  He tells me that he has significant insurance challenges and that is why he cannot afford expensive medications.  I told him about medications like Nexletol and PCSK9 medications especially since he is a diabetic and told him that he might develop some programs the companies have for patients who have financial challenges. Diabetes mellitus and morbid obesity: Diet was emphasized.  Lifestyle modification urged weight reduction stressed risks of obesity explained and patient promises to do better. Cigarette smoker: Patient quit 2 months ago.  He promises never to go back to smoking.  I congratulated him. Essential hypertension: Blood pressure stable and diet was emphasized.  He tells me that his blood pressure is better when he is not in pain.  I told him to seek the services of a pain specialist to help him with this issue. Patient will be seen in follow-up appointment in 6 months or earlier if the patient has any concerns    Medication Adjustments/Labs and Tests Ordered: Current medicines are reviewed at length with  the patient today.  Concerns regarding medicines are outlined above.  No orders of the defined types were placed in this encounter.  No orders of the defined types were placed in this encounter.    Chief Complaint  Patient presents with   ct results     History of Present Illness:    Danny Ramsey is a 61 y.o. male.  Patient has past medical history of decalcification, mild aortic ascending aortic enlargement, history of smoking he quit 2 months ago.,  COPD and diabetes mellitus.  He is morbidly obese and leads a sedentary lifestyle because of orthopedic issues.  He tells me that his significant pain which is why his blood pressure remains elevated.  At the time of my evaluation, the patient is alert awake oriented and in no distress.  Past Medical History:  Diagnosis Date   Anemia    Anxiety    Aortic calcification ([place]) 12/14/2020   Ascending aorta enlargement (HCC)    Calculus of kidney    Cigarette smoker 12/14/2020   COPD (chronic obstructive pulmonary disease) (HCC)    Diabetes (HCC)    Edema    Hypertension    Lumbago    chronic   Nephrolithiasis    Nodule of middle lobe of right lung    1.4cm   Sleep apnea    Tubulovillous adenoma polyp of colon    Type 2 diabetes mellitus with neurological manifestations [facility])     Past Surgical History:  Procedure Laterality Date   APPENDECTOMY  COLONOSCOPY  10/05/2013   hernia repair      Current Medications: Current Meds  Medication Sig   acetaminophen (TYLENOL) 650 MG CR tablet Take 1,300 mg by mouth as needed for pain.   albuterol (VENTOLIN HFA) 108 (90 Base) MCG/ACT inhaler Inhale 2 puffs into the lungs every 6 (six) hours.   doxazosin (CARDURA) 4 MG tablet Take 4 mg by mouth daily.   doxazosin (CARDURA) 8 MG tablet Take 8 mg by mouth daily.   Fluticasone Furoate-Vilanterol (BREO ELLIPTA IN) Inhale 1 puff into the lungs every morning.   furosemide (LASIX) 20 MG tablet Take 20 mg by mouth as needed for fluid or  edema.   gabapentin (NEURONTIN) 400 MG capsule Take 400 mg by mouth 2 (two) times daily.   glipiZIDE (GLUCOTROL) 10 MG tablet Take 10 mg by mouth 2 (two) times daily.   ipratropium-albuterol (DUONEB) 0.5-2.5 (3) MG/3ML SOLN Inhale 3 mLs into the lungs every 6 (six) hours.   JANUVIA 100 MG tablet Take 100 mg by mouth daily.   losartan-hydrochlorothiazide (HYZAAR) 100-25 MG tablet Take 1 tablet by mouth daily.   metFORMIN (GLUCOPHAGE) 500 MG tablet Take 2,000 mg by mouth daily.     Allergies:   Ace inhibitors, Actos [pioglitazone], Amitriptyline, Lipitor [atorvastatin], and Tricyclic antidepressants   Social History   Socioeconomic History   Marital status: Married    Spouse name: Not on file   Number of children: Not on file   Years of education: Not on file   Highest education level: Not on file  Occupational History   Not on file  Tobacco Use   Smoking status: Former    Types: Cigarettes    Quit date: 06/12/2010    Years since quitting: 11.5   Smokeless tobacco: Former  Substance and Sexual Activity   Alcohol use: Yes    Comment: social   Drug use: Never   Sexual activity: Not on file  Other Topics Concern   Not on file  Social History Narrative   Not on file   Social Determinants of Health   Financial Resource Strain: Not on file  Food Insecurity: Not on file  Transportation Needs: Not on file  Physical Activity: Not on file  Stress: Not on file  Social Connections: Not on file     Family History: The patient's family history includes Lung cancer in his father and mother.  ROS:   Please see the history of present illness.    All other systems reviewed and are negative.  EKGs/Labs/Other Studies Reviewed:    The following studies were reviewed today: I discussed my findings with the patient at length   Recent Labs: No results found for requested labs within last 365 days.  Recent Lipid Panel No results found for: "CHOL", "TRIG", "HDL", "CHOLHDL", "VLDL",  "LDLCALC", "LDLDIRECT"  Physical Exam:    VS:  BP (!) 168/62 (BP Location: Left Arm, Patient Position: Sitting)   Pulse 82   Ht 6' (1.829 m)   Wt (!) 305 lb (138.3 kg)   SpO2 91%   BMI 41.37 kg/m     Wt Readings from Last 3 Encounters:  12/24/21 (!) 305 lb (138.3 kg)  06/18/21 (!) 301 lb 3.2 oz (136.6 kg)  12/14/20 (!) 301 lb (136.5 kg)     GEN: Patient is in no acute distress HEENT: Normal NECK: No JVD; No carotid bruits LYMPHATICS: No lymphadenopathy CARDIAC: Hear sounds regular, 2/6 systolic murmur at the apex. RESPIRATORY:  Clear to auscultation without  rales, wheezing or rhonchi  ABDOMEN: Soft, non-tender, non-distended MUSCULOSKELETAL:  No edema; No deformity  SKIN: Warm and dry NEUROLOGIC:  Alert and oriented x 3 PSYCHIATRIC:  Normal affect   Signed, Jenean Lindau, MD  12/24/2021 1:42 PM    [facility]

## 2021-12-24 NOTE — Patient Instructions (Signed)
Medication Instructions:  Your physician recommends that you continue on your current medications as directed. Please refer to the Current Medication list given to you today.  *If you need a refill on your cardiac medications before your next appointment, please call your pharmacy*   Lab Work: NONE If you have labs (blood work) drawn today and your tests are completely normal, you will receive your results only by: [place] (if you have MyChart) OR A paper copy in the mail If you have any lab test that is abnormal or we need to change your treatment, we will call you to review the results.   Testing/Procedures: NONE   Follow-Up: At [facility], you and your health needs are our priority.  As part of our continuing mission to provide you with exceptional heart care, we have created designated Provider Care Teams.  These Care Teams include your primary Cardiologist (physician) and Advanced Practice Providers (APPs -  Physician Assistants and Nurse Practitioners) who all work together to provide you with the care you need, when you need it.  We recommend signing up for the patient portal called "MyChart".  Sign up information is provided on this After Visit Summary.  MyChart is used to connect with patients for Virtual Visits (Telemedicine).  Patients are able to view lab/test results, encounter notes, upcoming appointments, etc.  Non-urgent messages can be sent to your provider as well.   To learn more about what you can do with MyChart, go to [email or web address].    Your next appointment:   9 month(s)  The format for your next appointment:   In Person  Provider:   Jyl Heinz, MD    Other Instructions   Important Information About Sugar

## 2022-01-02 DIAGNOSIS — J449 Chronic obstructive pulmonary disease, unspecified: Secondary | ICD-10-CM | POA: Diagnosis not present

## 2022-01-09 DIAGNOSIS — G4733 Obstructive sleep apnea (adult) (pediatric): Secondary | ICD-10-CM | POA: Diagnosis not present

## 2022-01-15 DIAGNOSIS — I7789 Other specified disorders of arteries and arterioles: Secondary | ICD-10-CM | POA: Diagnosis not present

## 2022-01-15 DIAGNOSIS — E1149 Type 2 diabetes mellitus with other diabetic neurological complication: Secondary | ICD-10-CM | POA: Diagnosis not present

## 2022-01-15 DIAGNOSIS — J449 Chronic obstructive pulmonary disease, unspecified: Secondary | ICD-10-CM | POA: Diagnosis not present

## 2022-01-15 DIAGNOSIS — G4730 Sleep apnea, unspecified: Secondary | ICD-10-CM | POA: Diagnosis not present

## 2022-01-15 DIAGNOSIS — I10 Essential (primary) hypertension: Secondary | ICD-10-CM | POA: Diagnosis not present

## 2022-01-15 DIAGNOSIS — C9110 Chronic lymphocytic leukemia of B-cell type not having achieved remission: Secondary | ICD-10-CM | POA: Diagnosis not present

## 2022-01-15 DIAGNOSIS — Z6841 Body Mass Index (BMI) 40.0 and over, adult: Secondary | ICD-10-CM | POA: Diagnosis not present

## 2022-01-15 DIAGNOSIS — Z125 Encounter for screening for malignant neoplasm of prostate: Secondary | ICD-10-CM | POA: Diagnosis not present

## 2022-02-02 DIAGNOSIS — J449 Chronic obstructive pulmonary disease, unspecified: Secondary | ICD-10-CM | POA: Diagnosis not present

## 2022-02-09 DIAGNOSIS — G4733 Obstructive sleep apnea (adult) (pediatric): Secondary | ICD-10-CM | POA: Diagnosis not present

## 2022-03-04 DIAGNOSIS — J449 Chronic obstructive pulmonary disease, unspecified: Secondary | ICD-10-CM | POA: Diagnosis not present

## 2022-03-11 DIAGNOSIS — G4733 Obstructive sleep apnea (adult) (pediatric): Secondary | ICD-10-CM | POA: Diagnosis not present

## 2022-03-17 DIAGNOSIS — M7502 Adhesive capsulitis of left shoulder: Secondary | ICD-10-CM | POA: Diagnosis not present

## 2023-02-05 LAB — COMPREHENSIVE METABOLIC PANEL WITH GFR: EGFR: 101

## 2023-02-19 ENCOUNTER — Ambulatory Visit: Payer: Medicaid Other | Attending: Cardiology | Admitting: Cardiology

## 2023-02-19 ENCOUNTER — Encounter: Payer: Self-pay | Admitting: Cardiology

## 2023-02-19 VITALS — BP 160/80 | HR 73 | Ht 72.0 in | Wt 317.6 lb

## 2023-02-19 DIAGNOSIS — I700 Atherosclerosis of aorta: Secondary | ICD-10-CM | POA: Diagnosis not present

## 2023-02-19 DIAGNOSIS — I7789 Other specified disorders of arteries and arterioles: Secondary | ICD-10-CM | POA: Diagnosis not present

## 2023-02-19 DIAGNOSIS — J431 Panlobular emphysema: Secondary | ICD-10-CM

## 2023-02-19 NOTE — Progress Notes (Signed)
Cardiology Office Note:    Date:  02/19/2023   ID:  Danny Ramsey, DOB Aug 01, 1960, MRN [id number]  PCP:  Danny Peak, PA-C  Cardiologist:  Garwin Brothers, MD   Referring MD: Danny Peak, PA-C    ASSESSMENT:    1. Aortic calcification (HCC)   2. Ascending aorta enlargement (HCC)   3. Panlobular emphysema (HCC)    PLAN:    In order of problems listed above:  Aortic atherosclerosis: Stable at this time.  Secondary prevention stressed.  Importance of compliance with diet medication stressed any vocalized understanding. Essential hypertension: Blood pressure stable and diet was emphasized.  He mentions to me that he checks his blood pressures at home and they are much more better.  He has an element of whitecoat hypertension.  He also has had multiple doctor visits today and is a little tired.  He will keep a track of his blood pressures at home and send it to [place] Ascending aortic aneurysm: Has increased in size some since last evaluation.  I reviewed this with him at length.  We will plan to recheck this in 6 months.  He will see me in follow-up appointment in 6 months that time I will set him up for this.  I discussed signs of rupture such as chest pain and explain symptoms.  He vocalized understanding and questions were answered to his satisfaction. Obesity: Weight reduction stressed.  Diet emphasized and he promises to do better.  Risks of obesity explained. Mixed dyslipidemia: He had blood work recently from primary care.  I will obtain those numbers and in view of aortic atherosclerosis initiate him on statin.  He is agreeable.  Overall his lipids are fine and at least a low-dose statin should help.  If his liver is fine then I will start him on rosuvastatin 10 mg daily. Patient will be seen in follow-up appointment in 6 months or earlier if the patient has any concerns.    Medication Adjustments/Labs and Tests Ordered: Current medicines are reviewed at length with the patient  today.  Concerns regarding medicines are outlined above.  No orders of the defined types were placed in this encounter.  No orders of the defined types were placed in this encounter.    Chief Complaint  Patient presents with   Abnormal CT results     History of Present Illness:    Danny Ramsey is a 62 y.o. male.  Patient has past medical history of ascending aortic enlargement, aortic calcification, COPD, diabetes mellitus, essential hypertension and morbid obesity.  He denies any problems at this time and takes care of activities of daily living.  No chest pain orthopnea or PND.  He ambulates with a cane.  At the time of my evaluation, the patient is alert awake oriented and in no distress.  Past Medical History:  Diagnosis Date   Anemia    Anxiety    Aortic calcification (HCC) 12/14/2020   Ascending aorta enlargement (HCC)    Calculus of kidney    Cigarette smoker 12/14/2020   COPD (chronic obstructive pulmonary disease) (HCC)    Diabetes (HCC)    Edema    Hypertension    Lumbago    chronic   Nephrolithiasis    Nodule of middle lobe of right lung    1.4cm   Sleep apnea    Tubulovillous adenoma polyp of colon    Type 2 diabetes mellitus with neurological manifestations [facility])     Past Surgical History:  Procedure Laterality Date   APPENDECTOMY     COLONOSCOPY  10/05/2013   hernia repair      Current Medications: Current Meds  Medication Sig   acetaminophen (TYLENOL) 650 MG CR tablet Take 1,300 mg by mouth as needed for pain.   albuterol (VENTOLIN HFA) 108 (90 Base) MCG/ACT inhaler Inhale 2 puffs into the lungs every 6 (six) hours.   doxazosin (CARDURA) 4 MG tablet Take 4 mg by mouth daily.   doxazosin (CARDURA) 8 MG tablet Take 8 mg by mouth daily.   Fluticasone Furoate-Vilanterol (BREO ELLIPTA IN) Inhale 1 puff into the lungs every morning.   furosemide (LASIX) 20 MG tablet Take 20 mg by mouth as needed for fluid or edema.   gabapentin (NEURONTIN) 400 MG capsule  Take 400 mg by mouth 2 (two) times daily.   glipiZIDE (GLUCOTROL) 10 MG tablet Take 10 mg by mouth 2 (two) times daily.   Insulin Glargine (BASAGLAR KWIKPEN [place]) Inject 55 Units into the skin daily.   ipratropium-albuterol (DUONEB) 0.5-2.5 (3) MG/3ML SOLN Inhale 3 mLs into the lungs every 6 (six) hours.   losartan-hydrochlorothiazide (HYZAAR) 100-25 MG tablet Take 1 tablet by mouth daily.   metFORMIN (GLUCOPHAGE) 500 MG tablet Take 2,000 mg by mouth daily.   Tiotropium Bromide Monohydrate (SPIRIVA HANDIHALER IN) Inhale 2 puffs into the lungs daily.   [DISCONTINUED] JANUVIA 100 MG tablet Take 100 mg by mouth daily.     Allergies:   Ace inhibitors, Actos [pioglitazone], Amitriptyline, Lipitor [atorvastatin], and Tricyclic antidepressants   Social History   Socioeconomic History   Marital status: Married    Spouse name: Not on file   Number of children: Not on file   Years of education: Not on file   Highest education level: Not on file  Occupational History   Not on file  Tobacco Use   Smoking status: Former    Current packs/day: 0.00    Types: Cigarettes    Quit date: 06/12/2010    Years since quitting: 12.6   Smokeless tobacco: Former  Substance and Sexual Activity   Alcohol use: Yes    Comment: social   Drug use: Never   Sexual activity: Not on file  Other Topics Concern   Not on file  Social History Narrative   Not on file   Social Determinants of Health   Financial Resource Strain: Not on file  Food Insecurity: Not on file  Transportation Needs: Not on file  Physical Activity: Not on file  Stress: Not on file  Social Connections: Not on file     Family History: The patient's family history includes Lung cancer in his father and mother.  ROS:   Please see the history of present illness.    All other systems reviewed and are negative.  EKGs/Labs/Other Studies Reviewed:    The following studies were reviewed today: I discussed my findings with the patient at  length   Recent Labs: No results found for requested labs within last 365 days.  Recent Lipid Panel No results found for: "CHOL", "TRIG", "HDL", "CHOLHDL", "VLDL", "LDLCALC", "LDLDIRECT"  Physical Exam:    VS:  BP (!) 160/80 (BP Location: Left Arm, Patient Position: Sitting)   Pulse 73   Ht 6' (1.829 m)   Wt (!) 317 lb 9.6 oz (144.1 kg)   SpO2 90%   BMI 43.07 kg/m     Wt Readings from Last 3 Encounters:  02/19/23 (!) 317 lb 9.6 oz (144.1 kg)  12/24/21 (!) 305  lb (138.3 kg)  06/18/21 (!) 301 lb 3.2 oz (136.6 kg)     GEN: Patient is in no acute distress HEENT: Normal NECK: No JVD; No carotid bruits LYMPHATICS: No lymphadenopathy CARDIAC: Hear sounds regular, 2/6 systolic murmur at the apex. RESPIRATORY:  Clear to auscultation without rales, wheezing or rhonchi  ABDOMEN: Soft, non-tender, non-distended MUSCULOSKELETAL:  No edema; No deformity  SKIN: Warm and dry NEUROLOGIC:  Alert and oriented x 3 PSYCHIATRIC:  Normal affect   Signed, Garwin Brothers, MD  02/19/2023 3:43 PM    [facility]

## 2023-02-19 NOTE — Patient Instructions (Signed)
Medication Instructions:  Your physician recommends that you continue on your current medications as directed. Please refer to the Current Medication list given to you today.  *If you need a refill on your cardiac medications before your next appointment, please call your pharmacy*   Lab Work: None Ordered If you have labs (blood work) drawn today and your tests are completely normal, you will receive your results only by: MyChart Message (if you have MyChart) OR A paper copy in the mail If you have any lab test that is abnormal or we need to change your treatment, we will call you to review the results.   Testing/Procedures: None Ordered   Follow-Up: At [facility], you and your health needs are our priority.  As part of our continuing mission to provide you with exceptional heart care, we have created designated Provider Care Teams.  These Care Teams include your primary Cardiologist (physician) and Advanced Practice Providers (APPs -  Physician Assistants and Nurse Practitioners) who all work together to provide you with the care you need, when you need it.  We recommend signing up for the patient portal called "MyChart".  Sign up information is provided on this After Visit Summary.  MyChart is used to connect with patients for Virtual Visits (Telemedicine).  Patients are able to view lab/test results, encounter notes, upcoming appointments, etc.  Non-urgent messages can be sent to your provider as well.   To learn more about what you can do with MyChart, go to [email or web address].    Your next appointment:   6 month(s)  The format for your next appointment:   In Person  Provider:   Belva Crome MD    Other Instructions Call in 2 weeks if you haven't heard from [place] about cholesterol medications

## 2023-03-25 ENCOUNTER — Ambulatory Visit: Payer: 59 | Admitting: Cardiology

## 2023-08-06 DIAGNOSIS — J449 Chronic obstructive pulmonary disease, unspecified: Secondary | ICD-10-CM | POA: Diagnosis not present

## 2023-08-06 DIAGNOSIS — I7789 Other specified disorders of arteries and arterioles: Secondary | ICD-10-CM | POA: Diagnosis not present

## 2023-08-06 DIAGNOSIS — E1149 Type 2 diabetes mellitus with other diabetic neurological complication: Secondary | ICD-10-CM | POA: Diagnosis not present

## 2023-08-06 DIAGNOSIS — G4730 Sleep apnea, unspecified: Secondary | ICD-10-CM | POA: Diagnosis not present

## 2023-08-06 DIAGNOSIS — I10 Essential (primary) hypertension: Secondary | ICD-10-CM | POA: Diagnosis not present

## 2023-09-02 DIAGNOSIS — G4733 Obstructive sleep apnea (adult) (pediatric): Secondary | ICD-10-CM | POA: Diagnosis not present

## 2023-09-02 DIAGNOSIS — R918 Other nonspecific abnormal finding of lung field: Secondary | ICD-10-CM | POA: Diagnosis not present

## 2023-09-02 DIAGNOSIS — J449 Chronic obstructive pulmonary disease, unspecified: Secondary | ICD-10-CM | POA: Diagnosis not present

## 2023-10-07 DIAGNOSIS — G4733 Obstructive sleep apnea (adult) (pediatric): Secondary | ICD-10-CM | POA: Diagnosis not present

## 2023-10-23 DIAGNOSIS — J441 Chronic obstructive pulmonary disease with (acute) exacerbation: Secondary | ICD-10-CM | POA: Diagnosis not present

## 2023-11-11 DIAGNOSIS — Z1211 Encounter for screening for malignant neoplasm of colon: Secondary | ICD-10-CM | POA: Diagnosis not present

## 2023-11-11 DIAGNOSIS — J449 Chronic obstructive pulmonary disease, unspecified: Secondary | ICD-10-CM | POA: Diagnosis not present

## 2023-11-11 DIAGNOSIS — I7789 Other specified disorders of arteries and arterioles: Secondary | ICD-10-CM | POA: Diagnosis not present

## 2023-11-11 DIAGNOSIS — E1149 Type 2 diabetes mellitus with other diabetic neurological complication: Secondary | ICD-10-CM | POA: Diagnosis not present

## 2023-11-11 DIAGNOSIS — G4730 Sleep apnea, unspecified: Secondary | ICD-10-CM | POA: Diagnosis not present

## 2023-11-11 DIAGNOSIS — I10 Essential (primary) hypertension: Secondary | ICD-10-CM | POA: Diagnosis not present

## 2023-11-11 DIAGNOSIS — Z1331 Encounter for screening for depression: Secondary | ICD-10-CM | POA: Diagnosis not present

## 2023-11-11 DIAGNOSIS — D649 Anemia, unspecified: Secondary | ICD-10-CM | POA: Diagnosis not present

## 2023-11-22 DIAGNOSIS — J441 Chronic obstructive pulmonary disease with (acute) exacerbation: Secondary | ICD-10-CM | POA: Diagnosis not present

## 2023-12-09 DIAGNOSIS — J449 Chronic obstructive pulmonary disease, unspecified: Secondary | ICD-10-CM | POA: Diagnosis not present

## 2023-12-09 DIAGNOSIS — G4733 Obstructive sleep apnea (adult) (pediatric): Secondary | ICD-10-CM | POA: Diagnosis not present

## 2023-12-09 DIAGNOSIS — R5383 Other fatigue: Secondary | ICD-10-CM | POA: Diagnosis not present

## 2023-12-23 DIAGNOSIS — J441 Chronic obstructive pulmonary disease with (acute) exacerbation: Secondary | ICD-10-CM | POA: Diagnosis not present

## 2023-12-30 DIAGNOSIS — G4733 Obstructive sleep apnea (adult) (pediatric): Secondary | ICD-10-CM | POA: Diagnosis not present

## 2024-01-14 ENCOUNTER — Encounter: Payer: Self-pay | Admitting: Cardiology

## 2024-01-14 ENCOUNTER — Ambulatory Visit: Attending: Cardiology | Admitting: Cardiology

## 2024-01-14 VITALS — BP 130/70 | HR 77 | Ht 72.0 in | Wt 298.2 lb

## 2024-01-14 DIAGNOSIS — I700 Atherosclerosis of aorta: Secondary | ICD-10-CM

## 2024-01-14 DIAGNOSIS — I10 Essential (primary) hypertension: Secondary | ICD-10-CM | POA: Diagnosis not present

## 2024-01-14 DIAGNOSIS — I7121 Aneurysm of the ascending aorta, without rupture: Secondary | ICD-10-CM | POA: Insufficient documentation

## 2024-01-14 DIAGNOSIS — E782 Mixed hyperlipidemia: Secondary | ICD-10-CM | POA: Insufficient documentation

## 2024-01-14 DIAGNOSIS — Z79899 Other long term (current) drug therapy: Secondary | ICD-10-CM

## 2024-01-14 DIAGNOSIS — F17210 Nicotine dependence, cigarettes, uncomplicated: Secondary | ICD-10-CM | POA: Diagnosis not present

## 2024-01-14 MED ORDER — PITAVASTATIN MAGNESIUM 2 MG PO TABS: 2.0000 mg | ORAL_TABLET | Freq: Every day | ORAL | 3 refills | Status: DC

## 2024-01-14 NOTE — Progress Notes (Signed)
 Cardiology Office Note:    Date:  01/14/2024   ID:  Danny Ramsey, DOB 09/06/1960, MRN [id number]  PCP:  Montey Lot, PA-C  Cardiologist:  Jennifer JONELLE Crape, MD   Referring MD: Montey Lot, PA-C    ASSESSMENT:    1. Primary hypertension   2. Aortic calcification (HCC)   3. Aneurysm of ascending aorta without rupture (HCC)   4. Cigarette smoker   5. Mixed dyslipidemia    PLAN:    In order of problems listed above:  Coronary artery calcification noted on CT scan: Secondary prevention stressed with the patient.  Importance of compliance with diet medication stressed and patient verbalized standing.  He has no significant new symptoms.  Will monitor this.  He was advised to ambulate to the best of his ability. Essential hypertension: Blood pressure is stable and diet was emphasized.  Lifestyle modification urged. Mixed dyslipidemia: On lipid-lowering medications but he stopped because of some pain in the legs.  Will have blood work today and start pitavastatin  2 mg daily and do a lipid check in 6 weeks. Ex-smoker: Quit smoking many months ago and promises never to go back.  I congratulated him about this. Obesity: Weight reduction stressed diet emphasized and he promises to do better.  Risks of obesity explained. Patient will be seen in follow-up appointment in 6 months or earlier if the patient has any concerns.    Medication Adjustments/Labs and Tests Ordered: Current medicines are reviewed at length with the patient today.  Concerns regarding medicines are outlined above.  Orders Placed This Encounter  Procedures   EKG 12-Lead   No orders of the defined types were placed in this encounter.    No chief complaint on file.    History of Present Illness:    Danny Ramsey is a 63 y.o. male.  Patient has past medical history of ascending aortic aneurysm, coronary artery disease, essential hypertension, mixed dyslipidemia and COPD.  He has diabetes mellitus.  He denies any  problems at this time he eats a sedentary lifestyle.  He ambulates with a cane.  At the time of my evaluation, the patient is alert awake oriented and in no distress.  Past Medical History:  Diagnosis Date   Anemia    Anxiety    Aortic calcification (HCC) 12/14/2020   Ascending aorta enlargement (HCC)    Calculus of kidney    Cigarette smoker 12/14/2020   COPD (chronic obstructive pulmonary disease) (HCC)    Diabetes (HCC)    Edema    Hypertension    Lumbago    chronic   Nephrolithiasis    Nodule of middle lobe of right lung    1.4cm   Sleep apnea    Tubulovillous adenoma polyp of colon    Type 2 diabetes mellitus with neurological manifestations [facility])     Past Surgical History:  Procedure Laterality Date   APPENDECTOMY     COLONOSCOPY  10/05/2013   hernia repair      Current Medications: Current Meds  Medication Sig   acetaminophen (TYLENOL) 650 MG CR tablet Take 1,300 mg by mouth as needed for pain.   albuterol (VENTOLIN HFA) 108 (90 Base) MCG/ACT inhaler Inhale 2 puffs into the lungs every 6 (six) hours.   amLODipine (NORVASC) 10 MG tablet Take 10 mg by mouth every morning.   doxazosin (CARDURA) 4 MG tablet Take 4 mg by mouth daily.   doxazosin (CARDURA) 8 MG tablet Take 8 mg by mouth daily.  Fluticasone Furoate-Vilanterol (BREO ELLIPTA IN) Inhale 1 puff into the lungs every morning.   furosemide (LASIX) 20 MG tablet Take 20 mg by mouth as needed for fluid or edema.   gabapentin (NEURONTIN) 600 MG tablet Take 600 mg by mouth 3 (three) times daily.   glipiZIDE (GLUCOTROL) 10 MG tablet Take 10 mg by mouth 2 (two) times daily.   Insulin Glargine (BASAGLAR KWIKPEN ) Inject 55 Units into the skin daily.   ipratropium-albuterol (DUONEB) 0.5-2.5 (3) MG/3ML SOLN Inhale 3 mLs into the lungs every 6 (six) hours.   losartan-hydrochlorothiazide (HYZAAR) 100-25 MG tablet Take 1 tablet by mouth daily.   metFORMIN (GLUCOPHAGE) 500 MG tablet Take 2,000 mg by mouth daily.    Semaglutide, 2 MG/DOSE, (OZEMPIC, 2 MG/DOSE,) 8 MG/3ML SOPN Inject 2 mg into the skin once a week.   Tiotropium Bromide Monohydrate (SPIRIVA HANDIHALER IN) Inhale 2 puffs into the lungs daily.   TRELEGY ELLIPTA 100-62.5-25 MCG/ACT AEPB Inhale 1 puff into the lungs daily.     Allergies:   Ace inhibitors, Actos [pioglitazone], Amitriptyline, Lipitor [atorvastatin ], and Tricyclic antidepressants   Social History   Socioeconomic History   Marital status: Married    Spouse name: Not on file   Number of children: Not on file   Years of education: Not on file   Highest education level: Not on file  Occupational History   Not on file  Tobacco Use   Smoking status: Former    Current packs/day: 0.00    Types: Cigarettes    Quit date: 06/12/2010    Years since quitting: 13.6   Smokeless tobacco: Former  Substance and Sexual Activity   Alcohol use: Yes    Comment: social   Drug use: Never   Sexual activity: Not on file  Other Topics Concern   Not on file  Social History Narrative   Not on file   Social Drivers of Health   Financial Resource Strain: Not on file  Food Insecurity: Not on file  Transportation Needs: Not on file  Physical Activity: Not on file  Stress: Not on file  Social Connections: Not on file     Family History: The patient's family history includes Lung cancer in his father and mother.  ROS:   Please see the history of present illness.    All other systems reviewed and are negative.  EKGs/Labs/Other Studies Reviewed:    The following studies were reviewed today: .SABRAEKG Interpretation Date/Time:  Thursday January 14 2024 14:39:38 EDT Ventricular Rate:  77 PR Interval:  190 QRS Duration:  104 QT Interval:  396 QTC Calculation: 448 R Axis:   -88  Text Interpretation: Normal sinus rhythm Left anterior fascicular block Anterior infarct , age undetermined Abnormal ECG When compared with ECG of 19-Jan-2007 11:37, Left anterior fascicular block is now Present  Anterior infarct is now Present Confirmed by Edwyna Backers [id number]) on 01/14/2024 2:48:46 PM     Recent Labs: No results found for requested labs within last 365 days.  Recent Lipid Panel No results found for: CHOL, TRIG, HDL, CHOLHDL, VLDL, LDLCALC, LDLDIRECT  Physical Exam:    VS:  BP 130/70   Pulse 77   Ht 6' (1.829 m)   Wt 298 lb 3.2 oz (135.3 kg)   SpO2 94%   BMI 40.44 kg/m     Wt Readings from Last 3 Encounters:  01/14/24 298 lb 3.2 oz (135.3 kg)  02/19/23 (!) 317 lb 9.6 oz (144.1 kg)  12/24/21 (!) 305 lb (138.3  kg)     GEN: Patient is in no acute distress HEENT: Normal NECK: No JVD; No carotid bruits LYMPHATICS: No lymphadenopathy CARDIAC: Hear sounds regular, 2/6 systolic murmur at the apex. RESPIRATORY:  Clear to auscultation without rales, wheezing or rhonchi  ABDOMEN: Soft, non-tender, non-distended MUSCULOSKELETAL:  No edema; No deformity  SKIN: Warm and dry NEUROLOGIC:  Alert and oriented x 3 PSYCHIATRIC:  Normal affect   Signed, Jennifer JONELLE Crape, MD  01/14/2024 3:01 PM    [facility]

## 2024-01-14 NOTE — Patient Instructions (Signed)
 Medication Instructions:  Your physician has recommended you make the following change in your medication:  Start Pitavastatin  2 mg once daily in the evening Over the counter CoQ10 200 mg once daily  *If you need a refill on your cardiac medications before your next appointment, please call your pharmacy*  Lab Work: Your physician recommends that you return for lab work in: Today for Complete Metabolic Panel In 6 weeks another CMP and Fasting Lipid Panel Lab opens at 8am. You DO NOT NEED an appointment. Best time to come is between 8am and 11:45 and between 1:30 and 4:30. If you have been asked to fast for your blood work please have nothing to eat or drink after midnight. You may have water.   If you have labs (blood work) drawn today and your tests are completely normal, you will receive your results only by: MyChart Message (if you have MyChart) OR A paper copy in the mail If you have any lab test that is abnormal or we need to change your treatment, we will call you to review the results.  Testing/Procedures: NONE  Follow-Up: At [facility], you and your health needs are our priority.  As part of our continuing mission to provide you with exceptional heart care, our providers are all part of one team.  This team includes your primary Cardiologist (physician) and Advanced Practice Providers or APPs (Physician Assistants and Nurse Practitioners) who all work together to provide you with the care you need, when you need it.  Your next appointment:   9 month(s)  Provider:   Jennifer Crape, MD    We recommend signing up for the patient portal called MyChart.  Sign up information is provided on this After Visit Summary.  MyChart is used to connect with patients for Virtual Visits (Telemedicine).  Patients are able to view lab/test results, encounter notes, upcoming appointments, etc.  Non-urgent messages can be sent to your provider as well.   To learn more about what you can  do with MyChart, go to [email or web address].   Other Instructions

## 2024-01-14 NOTE — Addendum Note (Signed)
 Addended by: ARLOA DONOVAN SAUNDERS on: 01/14/2024 03:21 PM   Modules accepted: Orders

## 2024-01-15 ENCOUNTER — Other Ambulatory Visit (HOSPITAL_COMMUNITY): Payer: Self-pay

## 2024-01-15 ENCOUNTER — Ambulatory Visit: Payer: Self-pay | Admitting: Cardiology

## 2024-01-15 ENCOUNTER — Telehealth: Payer: Self-pay | Admitting: Pharmacy Technician

## 2024-01-15 ENCOUNTER — Telehealth: Payer: Self-pay | Admitting: Cardiology

## 2024-01-15 DIAGNOSIS — E782 Mixed hyperlipidemia: Secondary | ICD-10-CM

## 2024-01-15 LAB — COMPREHENSIVE METABOLIC PANEL WITH GFR
ALT: 19 IU/L (ref 0–44)
AST: 13 IU/L (ref 0–40)
Albumin: 4.2 g/dL (ref 3.8–4.9)
Alkaline Phosphatase: 80 IU/L (ref 44–121)
BUN/Creatinine Ratio: 24 (ref 10–24)
BUN: 20 mg/dL (ref 8–27)
Bilirubin Total: 0.2 mg/dL (ref 0.0–1.2)
CO2: 24 mmol/L (ref 20–29)
Calcium: 9.9 mg/dL (ref 8.6–10.2)
Chloride: 99 mmol/L (ref 96–106)
Creatinine, Ser: 0.85 mg/dL (ref 0.76–1.27)
Globulin, Total: 2.5 g/dL (ref 1.5–4.5)
Glucose: 70 mg/dL (ref 70–99)
Potassium: 4.1 mmol/L (ref 3.5–5.2)
Sodium: 141 mmol/L (ref 134–144)
Total Protein: 6.7 g/dL (ref 6.0–8.5)
eGFR: 99 mL/min/1.73 (ref >59)

## 2024-01-15 NOTE — Telephone Encounter (Signed)
 Pitavastatin  Magnesium  2 MG TABS   per pt calls    I called [facility] and told them to run the generic. I had to lmom for pharmacist.

## 2024-01-15 NOTE — Telephone Encounter (Signed)
 Pt c/o medication issue:  1. Name of Medication: Pitavastatin  Magnesium  2 MG TABS   2. How are you currently taking this medication (dosage and times per day)? 1  3. Are you having a reaction (difficulty breathing--STAT)? No  4. What is your medication issue? Insurance will not cover name brand, would like a generic alternative.

## 2024-01-20 NOTE — Telephone Encounter (Signed)
 Left vm to return call.

## 2024-01-20 NOTE — Telephone Encounter (Signed)
-----   Message from Jennifer SAUNDERS Revankar sent at 01/15/2024 12:19 PM EDT ----- The results of the study is unremarkable. Please inform patient. I will discuss in detail at next appointment. Cc  primary care/referring physician Jennifer SAUNDERS Crape, MD 01/15/2024 12:19 PM  ----- Message ----- From: Rebecka Memos Lab Results In Sent: 01/15/2024   5:39 AM EDT To: Jennifer SAUNDERS Crape, MD

## 2024-01-23 DIAGNOSIS — J441 Chronic obstructive pulmonary disease with (acute) exacerbation: Secondary | ICD-10-CM | POA: Diagnosis not present

## 2024-01-26 MED ORDER — PITAVASTATIN CALCIUM 2 MG PO TABS: 2.0000 mg | ORAL_TABLET | Freq: Every day | ORAL | 12 refills | Status: AC

## 2024-01-26 NOTE — Telephone Encounter (Signed)
 RX resent

## 2024-01-26 NOTE — Addendum Note (Signed)
 Addended by: ONEITA BERLINER on: 01/26/2024 10:36 AM   Modules accepted: Orders

## 2024-02-11 DIAGNOSIS — D509 Iron deficiency anemia, unspecified: Secondary | ICD-10-CM | POA: Diagnosis not present

## 2024-02-11 DIAGNOSIS — Z23 Encounter for immunization: Secondary | ICD-10-CM | POA: Diagnosis not present

## 2024-02-11 DIAGNOSIS — J449 Chronic obstructive pulmonary disease, unspecified: Secondary | ICD-10-CM | POA: Diagnosis not present

## 2024-02-11 DIAGNOSIS — E1129 Type 2 diabetes mellitus with other diabetic kidney complication: Secondary | ICD-10-CM | POA: Diagnosis not present

## 2024-02-11 DIAGNOSIS — E1149 Type 2 diabetes mellitus with other diabetic neurological complication: Secondary | ICD-10-CM | POA: Diagnosis not present

## 2024-02-11 DIAGNOSIS — Z125 Encounter for screening for malignant neoplasm of prostate: Secondary | ICD-10-CM | POA: Diagnosis not present

## 2024-02-11 DIAGNOSIS — R809 Proteinuria, unspecified: Secondary | ICD-10-CM | POA: Diagnosis not present

## 2024-02-11 DIAGNOSIS — I10 Essential (primary) hypertension: Secondary | ICD-10-CM | POA: Diagnosis not present

## 2024-02-11 DIAGNOSIS — G4733 Obstructive sleep apnea (adult) (pediatric): Secondary | ICD-10-CM | POA: Diagnosis not present

## 2024-02-11 DIAGNOSIS — I7789 Other specified disorders of arteries and arterioles: Secondary | ICD-10-CM | POA: Diagnosis not present

## 2024-02-11 DIAGNOSIS — G4730 Sleep apnea, unspecified: Secondary | ICD-10-CM | POA: Diagnosis not present

## 2024-02-11 DIAGNOSIS — R918 Other nonspecific abnormal finding of lung field: Secondary | ICD-10-CM | POA: Diagnosis not present

## 2024-02-22 DIAGNOSIS — J441 Chronic obstructive pulmonary disease with (acute) exacerbation: Secondary | ICD-10-CM | POA: Diagnosis not present

## 2024-02-25 DIAGNOSIS — E782 Mixed hyperlipidemia: Secondary | ICD-10-CM | POA: Diagnosis not present

## 2024-02-25 DIAGNOSIS — F17210 Nicotine dependence, cigarettes, uncomplicated: Secondary | ICD-10-CM | POA: Diagnosis not present

## 2024-02-25 DIAGNOSIS — I700 Atherosclerosis of aorta: Secondary | ICD-10-CM | POA: Diagnosis not present

## 2024-02-25 DIAGNOSIS — Z79899 Other long term (current) drug therapy: Secondary | ICD-10-CM | POA: Diagnosis not present

## 2024-02-25 DIAGNOSIS — I7121 Aneurysm of the ascending aorta, without rupture: Secondary | ICD-10-CM | POA: Diagnosis not present

## 2024-02-25 DIAGNOSIS — I10 Essential (primary) hypertension: Secondary | ICD-10-CM | POA: Diagnosis not present

## 2024-02-26 LAB — COMPREHENSIVE METABOLIC PANEL WITH GFR
ALT: 15 IU/L (ref 0–44)
AST: 12 IU/L (ref 0–40)
Albumin: 4.2 g/dL (ref 3.8–4.9)
Alkaline Phosphatase: 78 IU/L (ref 47–123)
BUN/Creatinine Ratio: 23 (ref 10–24)
BUN: 17 mg/dL (ref 8–27)
Bilirubin Total: <0.2 mg/dL (ref 0.0–1.2)
CO2: 23 mmol/L (ref 20–29)
Calcium: 9.3 mg/dL (ref 8.6–10.2)
Chloride: 99 mmol/L (ref 96–106)
Creatinine, Ser: 0.74 mg/dL — ABNORMAL LOW (ref 0.76–1.27)
Globulin, Total: 2.4 g/dL (ref 1.5–4.5)
Glucose: 120 mg/dL — ABNORMAL HIGH (ref 70–99)
Potassium: 4.2 mmol/L (ref 3.5–5.2)
Sodium: 141 mmol/L (ref 134–144)
Total Protein: 6.6 g/dL (ref 6.0–8.5)
eGFR: 104 mL/min/1.73 (ref >59)

## 2024-02-26 LAB — LIPID PANEL
Chol/HDL Ratio: 4 ratio (ref 0.0–5.0)
Cholesterol, Total: 135 mg/dL (ref 100–199)
HDL: 34 mg/dL — ABNORMAL LOW (ref >39)
LDL Chol Calc (NIH): 74 mg/dL (ref 0–99)
Triglycerides: 156 mg/dL — ABNORMAL HIGH (ref 0–149)
VLDL Cholesterol Cal: 27 mg/dL (ref 5–40)

## 2024-03-02 DIAGNOSIS — R5383 Other fatigue: Secondary | ICD-10-CM | POA: Diagnosis not present

## 2024-03-02 DIAGNOSIS — G4733 Obstructive sleep apnea (adult) (pediatric): Secondary | ICD-10-CM | POA: Diagnosis not present

## 2024-03-02 DIAGNOSIS — J449 Chronic obstructive pulmonary disease, unspecified: Secondary | ICD-10-CM | POA: Diagnosis not present

## 2024-03-02 DIAGNOSIS — I714 Abdominal aortic aneurysm, without rupture, unspecified: Secondary | ICD-10-CM | POA: Diagnosis not present

## 2024-03-03 DIAGNOSIS — R5383 Other fatigue: Secondary | ICD-10-CM | POA: Diagnosis not present

## 2024-03-17 DIAGNOSIS — R918 Other nonspecific abnormal finding of lung field: Secondary | ICD-10-CM | POA: Diagnosis not present

## 2024-03-17 DIAGNOSIS — J449 Chronic obstructive pulmonary disease, unspecified: Secondary | ICD-10-CM | POA: Diagnosis not present

## 2024-03-17 DIAGNOSIS — G4733 Obstructive sleep apnea (adult) (pediatric): Secondary | ICD-10-CM | POA: Diagnosis not present

## 2024-03-17 DIAGNOSIS — R5383 Other fatigue: Secondary | ICD-10-CM | POA: Diagnosis not present

## 2024-03-24 DIAGNOSIS — J441 Chronic obstructive pulmonary disease with (acute) exacerbation: Secondary | ICD-10-CM | POA: Diagnosis not present

## 2024-04-05 DIAGNOSIS — K219 Gastro-esophageal reflux disease without esophagitis: Secondary | ICD-10-CM | POA: Diagnosis not present

## 2024-04-23 DIAGNOSIS — J441 Chronic obstructive pulmonary disease with (acute) exacerbation: Secondary | ICD-10-CM | POA: Diagnosis not present
